# Patient Record
Sex: Male | Born: 1945 | Race: White | Hispanic: No
Health system: Southern US, Community
[De-identification: ages and names within clinical notes are randomized; demographics above are authoritative.]

---

## 2013-03-07 LAB — COMPREHENSIVE METABOLIC PANEL
Albumin: 3.2 g/dL — ABNORMAL LOW (ref 3.4–5.0)
Alkaline Phosphatase: 154 U/L — ABNORMAL HIGH (ref 50–136)
Anion Gap: 6 — ABNORMAL LOW (ref 7–16)
BUN: 21 mg/dL — ABNORMAL HIGH (ref 7–18)
Bilirubin,Total: 0.5 mg/dL (ref 0.2–1.0)
Chloride: 100 mmol/L (ref 98–107)
Co2: 35 mmol/L — ABNORMAL HIGH (ref 21–32)
Creatinine: 1.27 mg/dL (ref 0.60–1.30)
EGFR (African American): >60
EGFR (Non-African Amer.): 59 — ABNORMAL LOW
Glucose: 270 mg/dL — ABNORMAL HIGH (ref 65–99)
Osmolality: 294 (ref 275–301)
Potassium: 4.9 mmol/L (ref 3.5–5.1)
SGOT(AST): 13 U/L — ABNORMAL LOW (ref 15–37)
SGPT (ALT): 19 U/L (ref 12–78)
Total Protein: 7.1 g/dL (ref 6.4–8.2)

## 2013-03-07 LAB — TROPONIN I: Troponin-I: 0.14 ng/mL — ABNORMAL HIGH

## 2013-03-07 LAB — URINALYSIS, COMPLETE
Bacteria: NONE SEEN
Ph: 6 (ref 4.5–8.0)
Protein: NEGATIVE
RBC,UR: NONE SEEN /HPF (ref 0–5)
Specific Gravity: 1.008 (ref 1.003–1.030)
Squamous Epithelial: <1
WBC UR: <1 /HPF (ref 0–5)

## 2013-03-07 LAB — PROTIME-INR: INR: 1.1

## 2013-03-07 LAB — CBC
HGB: 13.5 g/dL (ref 13.0–18.0)
MCH: 29.4 pg (ref 26.0–34.0)
MCHC: 31.9 g/dL — ABNORMAL LOW (ref 32.0–36.0)
MCV: 92 fL (ref 80–100)
RBC: 4.59 10*6/uL (ref 4.40–5.90)
RDW: 17.1 % — ABNORMAL HIGH (ref 11.5–14.5)

## 2013-03-07 LAB — APTT: Activated PTT: 30 secs (ref 23.6–35.9)

## 2013-03-08 ENCOUNTER — Inpatient Hospital Stay: Payer: Self-pay | Admitting: Specialist

## 2013-03-08 LAB — TROPONIN I
Troponin-I: 0.2 ng/mL — ABNORMAL HIGH
Troponin-I: 0.22 ng/mL — ABNORMAL HIGH

## 2013-03-08 LAB — CK TOTAL AND CKMB (NOT AT ARMC)
CK, Total: 46 U/L (ref 35–232)
CK-MB: 3.4 ng/mL (ref 0.5–3.6)
CK-MB: 4.7 ng/mL — ABNORMAL HIGH (ref 0.5–3.6)

## 2013-03-09 LAB — CBC WITH DIFFERENTIAL/PLATELET
Basophil #: 0.1 10*3/uL (ref 0.0–0.1)
Basophil %: 0.8 %
HCT: 38.5 % — ABNORMAL LOW (ref 40.0–52.0)
HGB: 12.5 g/dL — ABNORMAL LOW (ref 13.0–18.0)
MCH: 29.8 pg (ref 26.0–34.0)
MCHC: 32.4 g/dL (ref 32.0–36.0)
Monocyte %: 11.5 %
Neutrophil %: 71.3 %
RDW: 16.7 % — ABNORMAL HIGH (ref 11.5–14.5)
WBC: 6.8 10*3/uL (ref 3.8–10.6)

## 2013-03-09 LAB — BASIC METABOLIC PANEL
BUN: 23 mg/dL — ABNORMAL HIGH (ref 7–18)
Calcium, Total: 8.5 mg/dL (ref 8.5–10.1)
Chloride: 94 mmol/L — ABNORMAL LOW (ref 98–107)
Creatinine: 1.15 mg/dL (ref 0.60–1.30)
EGFR (African American): >60
Osmolality: 276 (ref 275–301)
Potassium: 4 mmol/L (ref 3.5–5.1)

## 2013-03-09 LAB — HEMOGLOBIN A1C: Hemoglobin A1C: 9.1 % — ABNORMAL HIGH (ref 4.2–6.3)

## 2013-03-10 LAB — BASIC METABOLIC PANEL
Glucose: 120 mg/dL — ABNORMAL HIGH (ref 65–99)
Sodium: 136 mmol/L (ref 136–145)

## 2013-03-11 LAB — BASIC METABOLIC PANEL
Chloride: 93 mmol/L — ABNORMAL LOW (ref 98–107)
Co2: 43 mmol/L (ref 21–32)
EGFR (African American): >60
Glucose: 107 mg/dL — ABNORMAL HIGH (ref 65–99)
Potassium: 3.6 mmol/L (ref 3.5–5.1)
Sodium: 136 mmol/L (ref 136–145)

## 2013-03-12 LAB — CULTURE, BLOOD (SINGLE)

## 2013-03-13 LAB — WOUND CULTURE

## 2013-03-14 ENCOUNTER — Inpatient Hospital Stay: Payer: Self-pay | Admitting: Internal Medicine

## 2013-03-14 LAB — CBC
HCT: 38.2 % — ABNORMAL LOW (ref 40.0–52.0)
HGB: 12.6 g/dL — ABNORMAL LOW (ref 13.0–18.0)
MCH: 30.2 pg (ref 26.0–34.0)
MCHC: 33 g/dL (ref 32.0–36.0)
Platelet: 186 10*3/uL (ref 150–440)
RBC: 4.17 10*6/uL — ABNORMAL LOW (ref 4.40–5.90)
RDW: 16.8 % — ABNORMAL HIGH (ref 11.5–14.5)

## 2013-03-14 LAB — HEPATIC FUNCTION PANEL A (ARMC)
Albumin: 3.1 g/dL — ABNORMAL LOW (ref 3.4–5.0)
Alkaline Phosphatase: 232 U/L — ABNORMAL HIGH (ref 50–136)
Bilirubin, Direct: 0.2 mg/dL (ref 0.00–0.20)
Bilirubin,Total: 0.5 mg/dL (ref 0.2–1.0)
SGOT(AST): 53 U/L — ABNORMAL HIGH (ref 15–37)
SGPT (ALT): 56 U/L (ref 12–78)
Total Protein: 6.8 g/dL (ref 6.4–8.2)

## 2013-03-14 LAB — CK TOTAL AND CKMB (NOT AT ARMC)
CK, Total: 37 U/L (ref 35–232)
CK-MB: 1.9 ng/mL (ref 0.5–3.6)

## 2013-03-14 LAB — BASIC METABOLIC PANEL
Anion Gap: 2 — ABNORMAL LOW (ref 7–16)
Calcium, Total: 8.9 mg/dL (ref 8.5–10.1)
Co2: 39 mmol/L — ABNORMAL HIGH (ref 21–32)
EGFR (African American): >60
EGFR (Non-African Amer.): >60
Osmolality: 285 (ref 275–301)
Sodium: 137 mmol/L (ref 136–145)

## 2013-03-14 LAB — PRO B NATRIURETIC PEPTIDE: B-Type Natriuretic Peptide: 10045 pg/mL — ABNORMAL HIGH (ref 0–125)

## 2013-03-14 LAB — TROPONIN I: Troponin-I: 0.17 ng/mL — ABNORMAL HIGH

## 2013-03-15 LAB — BASIC METABOLIC PANEL
BUN: 26 mg/dL — ABNORMAL HIGH (ref 7–18)
Calcium, Total: 8.5 mg/dL (ref 8.5–10.1)
Chloride: 93 mmol/L — ABNORMAL LOW (ref 98–107)
Co2: 45 mmol/L (ref 21–32)
EGFR (Non-African Amer.): >60
Glucose: 191 mg/dL — ABNORMAL HIGH (ref 65–99)
Osmolality: 282 (ref 275–301)
Potassium: 3.6 mmol/L (ref 3.5–5.1)
Sodium: 136 mmol/L (ref 136–145)

## 2013-03-15 LAB — CBC WITH DIFFERENTIAL/PLATELET
Basophil #: 0 10*3/uL (ref 0.0–0.1)
Basophil %: 0.5 %
Eosinophil %: 1.9 %
HCT: 37.7 % — ABNORMAL LOW (ref 40.0–52.0)
Lymphocyte %: 11.1 %
MCH: 28.1 pg (ref 26.0–34.0)
Neutrophil #: 6.1 10*3/uL (ref 1.4–6.5)
Neutrophil %: 74.4 %
Platelet: 183 10*3/uL (ref 150–440)
RBC: 4.12 10*6/uL — ABNORMAL LOW (ref 4.40–5.90)
RDW: 16.3 % — ABNORMAL HIGH (ref 11.5–14.5)
WBC: 8.2 10*3/uL (ref 3.8–10.6)

## 2013-03-15 LAB — TROPONIN I: Troponin-I: 0.18 ng/mL — ABNORMAL HIGH

## 2013-03-15 LAB — MAGNESIUM: Magnesium: 1.8 mg/dL

## 2013-03-15 LAB — PRO B NATRIURETIC PEPTIDE: B-Type Natriuretic Peptide: 9300 pg/mL — ABNORMAL HIGH (ref 0–125)

## 2013-03-17 LAB — BASIC METABOLIC PANEL
BUN: 25 mg/dL — ABNORMAL HIGH (ref 7–18)
Chloride: 90 mmol/L — ABNORMAL LOW (ref 98–107)
Co2: 41 mmol/L (ref 21–32)
Creatinine: 1.07 mg/dL (ref 0.60–1.30)
EGFR (African American): >60
EGFR (Non-African Amer.): >60
Potassium: 3.6 mmol/L (ref 3.5–5.1)

## 2013-04-04 ENCOUNTER — Inpatient Hospital Stay: Payer: Self-pay | Admitting: Internal Medicine

## 2013-04-04 LAB — COMPREHENSIVE METABOLIC PANEL
Alkaline Phosphatase: 381 U/L — ABNORMAL HIGH (ref 50–136)
BUN: 21 mg/dL — ABNORMAL HIGH (ref 7–18)
Bilirubin,Total: 0.7 mg/dL (ref 0.2–1.0)
Calcium, Total: 9.3 mg/dL (ref 8.5–10.1)
Creatinine: 1.04 mg/dL (ref 0.60–1.30)
EGFR (African American): >60
EGFR (Non-African Amer.): >60
Osmolality: 276 (ref 275–301)
SGPT (ALT): 47 U/L (ref 12–78)

## 2013-04-04 LAB — CBC
MCH: 30.1 pg (ref 26.0–34.0)
MCV: 93 fL (ref 80–100)
RBC: 4.65 10*6/uL (ref 4.40–5.90)
WBC: 7.3 10*3/uL (ref 3.8–10.6)

## 2013-04-04 LAB — CK TOTAL AND CKMB (NOT AT ARMC)
CK, Total: 31 U/L — ABNORMAL LOW (ref 35–232)
CK-MB: 2.8 ng/mL (ref 0.5–3.6)

## 2013-04-05 LAB — CBC WITH DIFFERENTIAL/PLATELET
Basophil #: 0.1 10*3/uL (ref 0.0–0.1)
HCT: 37.2 % — ABNORMAL LOW (ref 40.0–52.0)
Lymphocyte #: 0.7 10*3/uL — ABNORMAL LOW (ref 1.0–3.6)
Lymphocyte %: 10.6 %
MCH: 30.3 pg (ref 26.0–34.0)
Neutrophil #: 5.2 10*3/uL (ref 1.4–6.5)
Neutrophil %: 76.4 %
Platelet: 169 10*3/uL (ref 150–440)
RDW: 15.9 % — ABNORMAL HIGH (ref 11.5–14.5)
WBC: 6.9 10*3/uL (ref 3.8–10.6)

## 2013-04-05 LAB — COMPREHENSIVE METABOLIC PANEL
Alkaline Phosphatase: 328 U/L — ABNORMAL HIGH (ref 50–136)
Anion Gap: 4 — ABNORMAL LOW (ref 7–16)
Co2: 38 mmol/L — ABNORMAL HIGH (ref 21–32)
EGFR (African American): >60
EGFR (Non-African Amer.): >60
Osmolality: 283 (ref 275–301)
Potassium: 4.3 mmol/L (ref 3.5–5.1)
SGOT(AST): 18 U/L (ref 15–37)
SGPT (ALT): 33 U/L (ref 12–78)
Sodium: 137 mmol/L (ref 136–145)
Total Protein: 6.2 g/dL — ABNORMAL LOW (ref 6.4–8.2)

## 2013-04-05 LAB — PROTIME-INR
INR: 1.2
Prothrombin Time: 15.2 secs — ABNORMAL HIGH (ref 11.5–14.7)

## 2013-04-06 LAB — BASIC METABOLIC PANEL
Anion Gap: 0 — ABNORMAL LOW (ref 7–16)
Calcium, Total: 8.6 mg/dL (ref 8.5–10.1)
Co2: 42 mmol/L (ref 21–32)
Creatinine: 1.11 mg/dL (ref 0.60–1.30)
EGFR (Non-African Amer.): >60
Potassium: 3.8 mmol/L (ref 3.5–5.1)

## 2013-04-07 LAB — BASIC METABOLIC PANEL
Anion Gap: 1 — ABNORMAL LOW (ref 7–16)
BUN: 26 mg/dL — ABNORMAL HIGH (ref 7–18)
Calcium, Total: 8.8 mg/dL (ref 8.5–10.1)
Creatinine: 1.06 mg/dL (ref 0.60–1.30)
EGFR (African American): >60
EGFR (Non-African Amer.): >60
Glucose: 99 mg/dL (ref 65–99)
Osmolality: 275 (ref 275–301)
Potassium: 4 mmol/L (ref 3.5–5.1)

## 2013-05-30 DEATH — deceased

## 2014-03-12 IMAGING — CR DG CHEST 1V PORT
1 series · 1 of 1 positions shown · non-contrast
Comparison: none

REASON FOR EXAM: SOB
COMMENTS:

[ap]
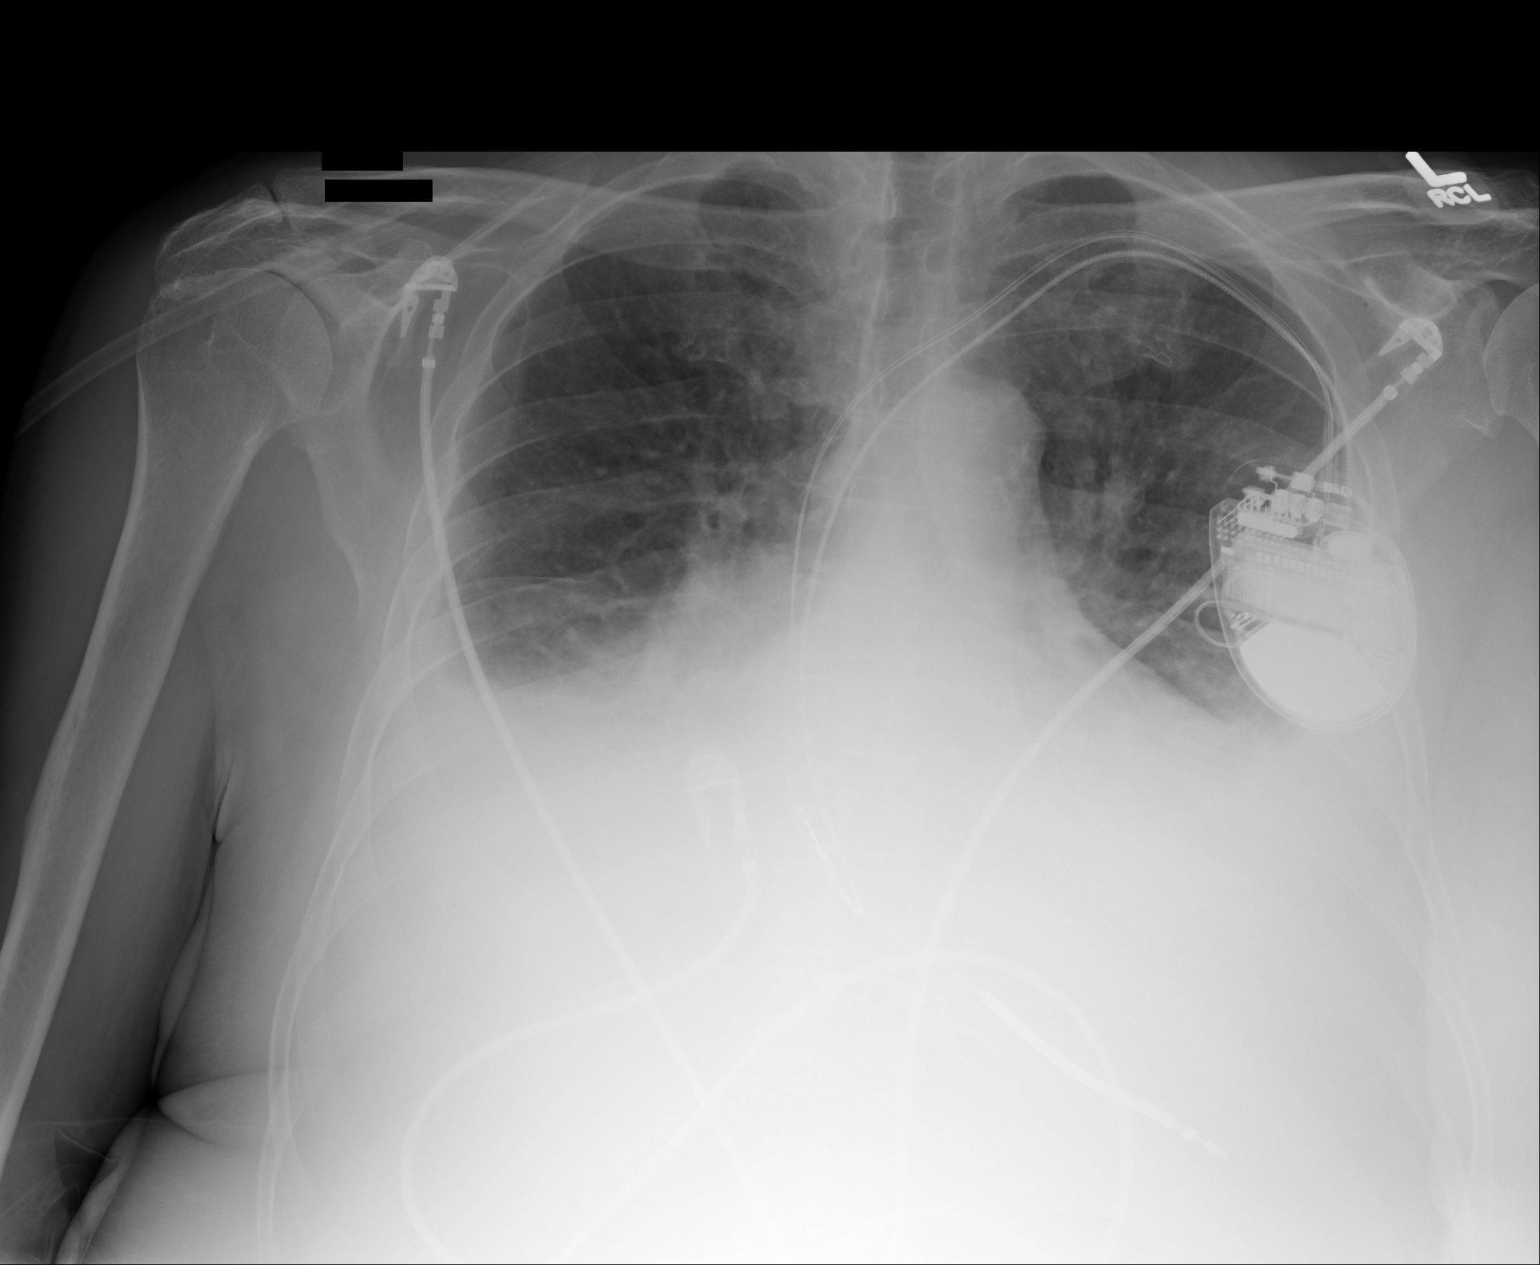

[1 of 1 positions shown; findings below may reference images not displayed]

PROCEDURE:     DXR - DXR PORTABLE CHEST SINGLE VIEW  - March 14, 2013  [DATE]

RESULT:     Comparison is made to the study March 08, 2013.

The lung volumes remain low. There are bilateral pleural effusions blunting
the costophrenic gutters. The cardiac silhouette is enlarged but largely
obscured. The pulmonary vascularity is engorged. A permanent
pacemaker-defibrillator is in place.
IMPRESSION: The findings are consistent with congestive heart failure
with bilateral pleural effusions. There remains mild pulmonary interstitial
edema which has improved somewhat since March 08, 2013.

[REDACTED]

## 2014-03-14 IMAGING — CR DG CHEST 2V
1 series · 2 of 2 positions shown · non-contrast
Comparison: none

REASON FOR EXAM: sob
COMMENTS:

[Series 4: w chest pa · 0.14mm/px · 2 of 2 slices shown]
[im 1/2]
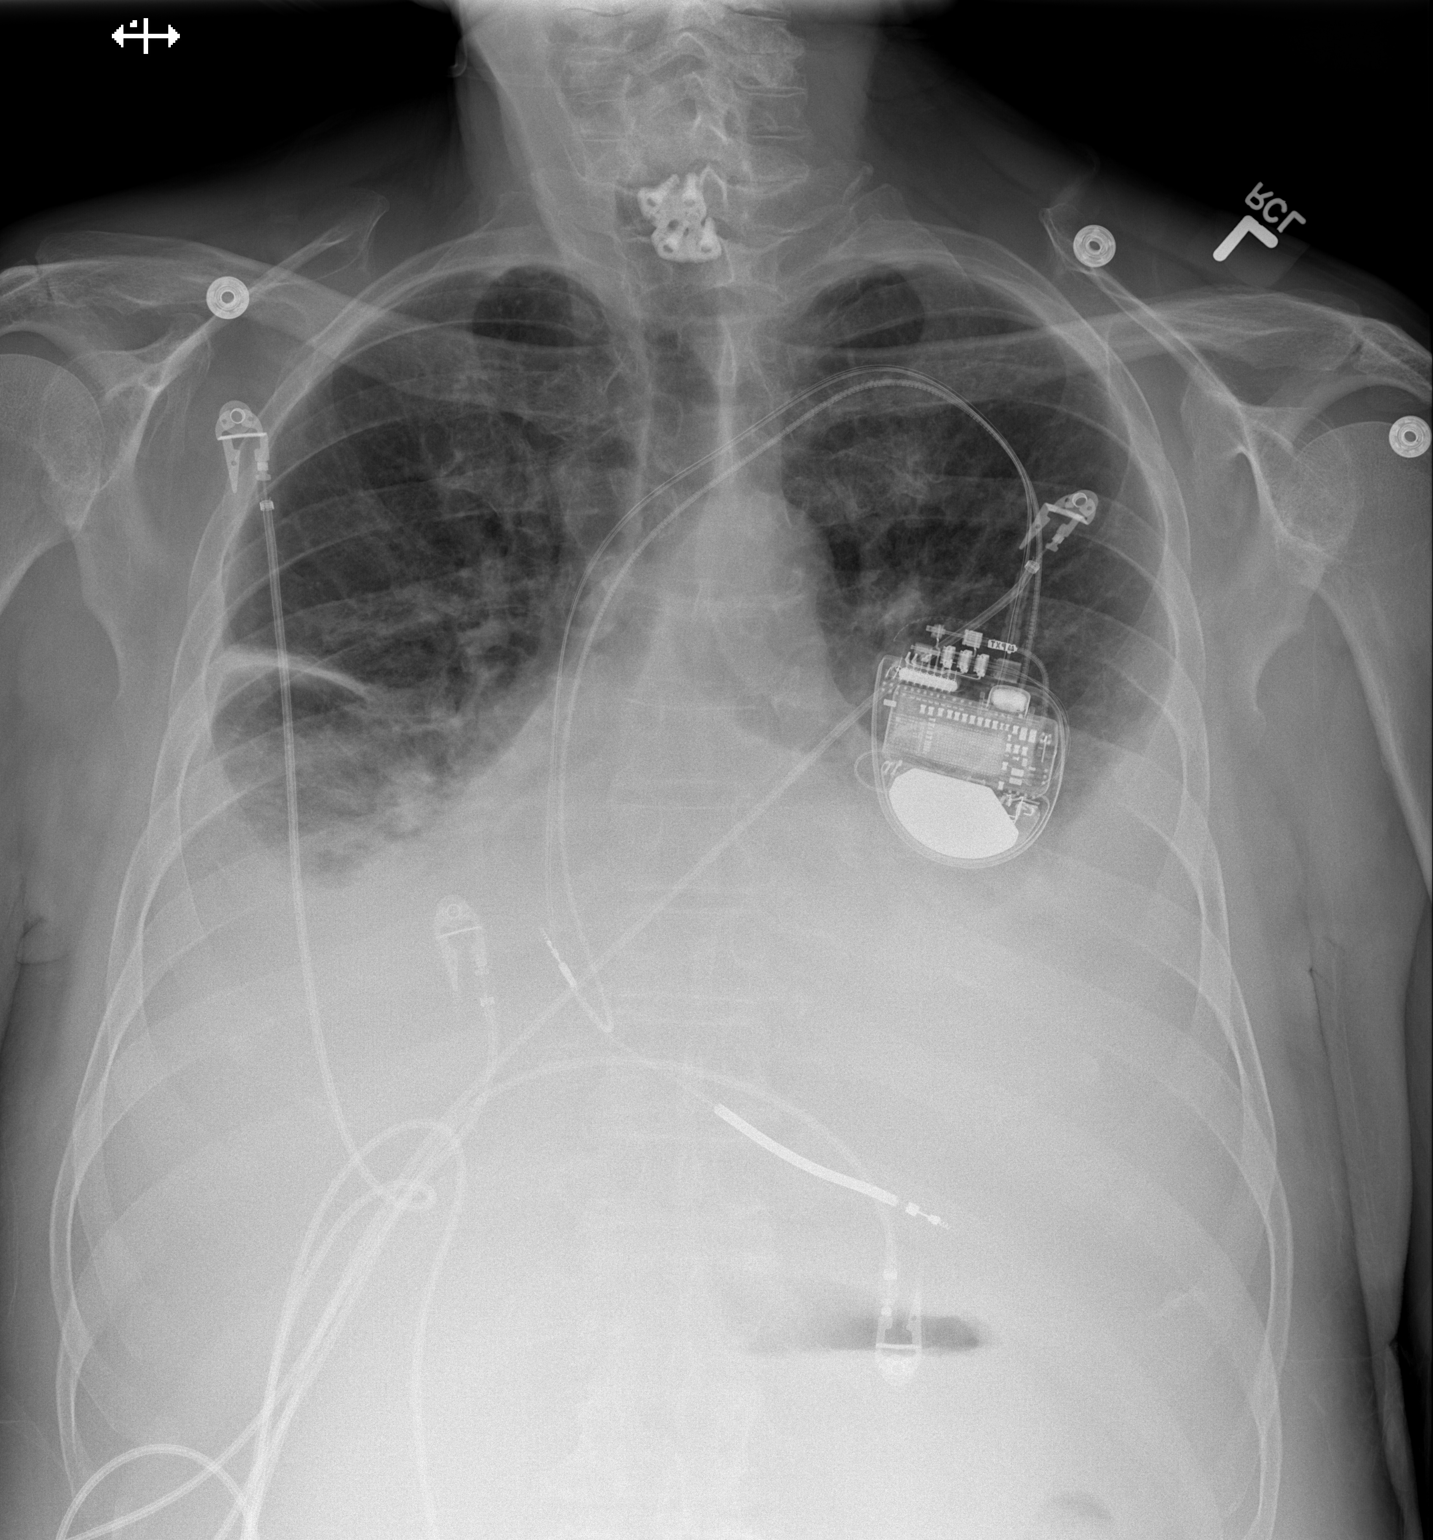
[im 2/2]
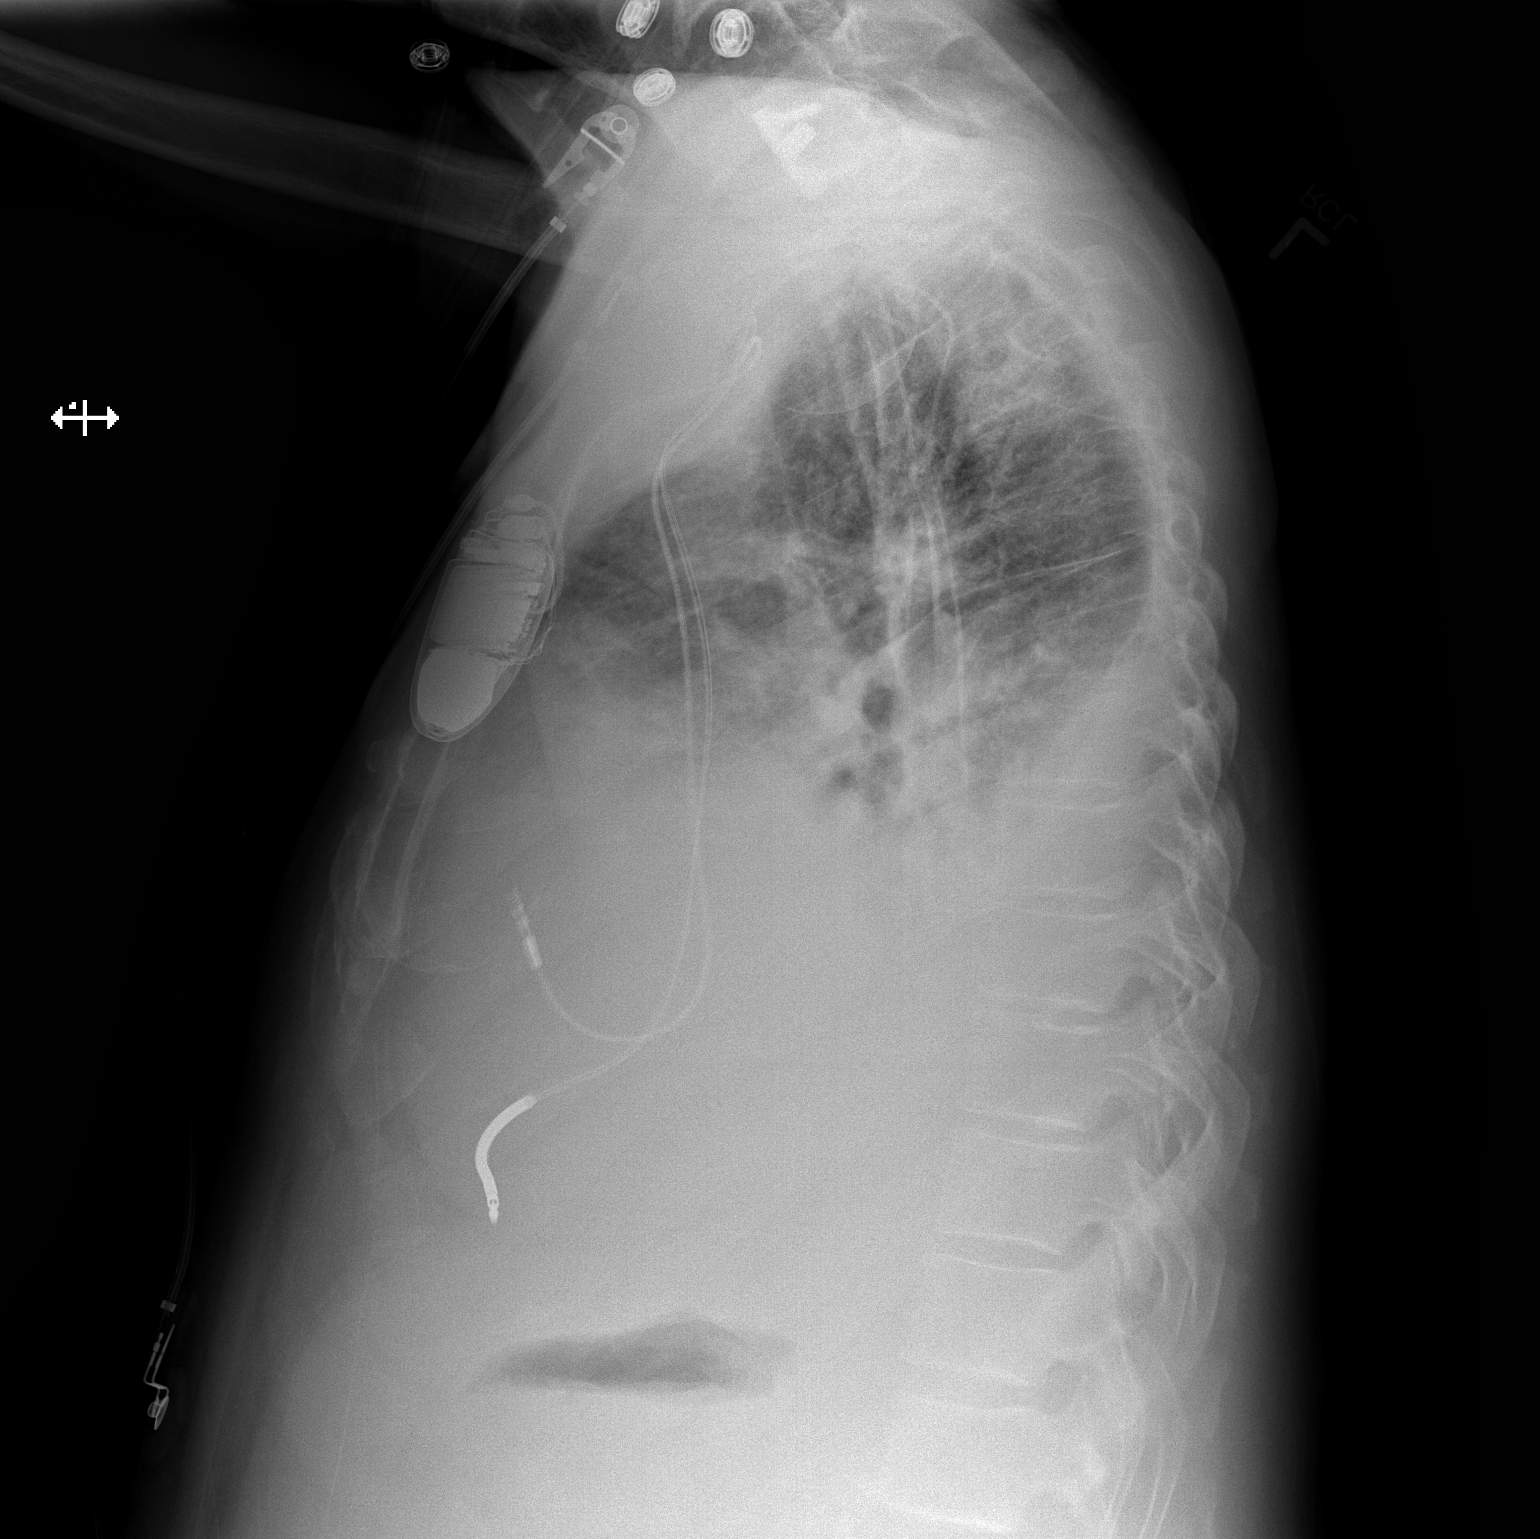

[2 of 2 positions shown; findings below may reference images not displayed]

PROCEDURE:     DXR - DXR CHEST PA (OR AP) AND LATERAL  - March 16, 2013 [DATE]

RESULT:     Comparison is made to the study March 14, 2013.

There are moderate sized bilateral pleural effusions. The volume of fluid on
the right has increased since the previous study. The cardiac silhouette
remains enlarged. The pulmonary interstitial markings are more prominent
today and the pulmonary vascularity remains engorged. A permanent
pacemaker-defibrillator is in place.
IMPRESSION: The findings are consistent with  worsening of pulmonary
interstitial edema and an increase in the size of a right pleural effusion
secondary to CHF.

[REDACTED]

## 2014-12-20 NOTE — Discharge Summary (Signed)
PATIENT NAME:  Victor, Davenport MR#:  [id number] DATE OF BIRTH:  [birth year]  DATE OF ADMISSION:  03/08/2013 DATE OF DISCHARGE:  03/11/2013  For a detailed note, please take a look at the history and physical done on admission by Dr. Amado Coe.     DIAGNOSES AT DISCHARGE: As follows:  1.  Congestive heart failure, acute on chronic systolic dysfunction.  2.  Scrotal and lower extremity edema secondary to the congestive heart failure.  3.  Right groin fungal rash.  4.  Right inguinal hernia.  5.  Cardiomyopathy, ejection fraction of 15%.  6.  Diabetes.  7.  Hyperlipidemia.  8.  Anxiety.   DIET: The patient is being discharged on a low-sodium, low-fat, American diabetic Association diet.   ACTIVITY: As tolerated.   FOLLOWUP:  Follow up with Dr. Egbert Garibaldi in the next 1 to 2 weeks. Also, follow up with Dr. Nash Dimmer, the patient's primary care physician, in the next 1 to 2 weeks.   DISCHARGE MEDICATIONS:  NovoLog flex pen based on sliding scale, Lipitor 80 mg at bedtime, Aldactone 25 mg, 12.5 daily which is 1/2 tab daily, aspirin 81 mg daily, Ativan 0.5 mg t.i.d. as needed, Plavix 75 mg daily, ramipril 1.25 mg daily, metformin 1000 mg b.i.d., Lasix 40 mg b.i.d. and nystatin powder to be applied to the right groin fungal rash b.i.d. for the next 7 to 10 days.   CONSULTANTS DURING THE HOSPITAL COURSE: Dr. Natale Lay from general surgery, Dr. Mariel Kansky from cardiology.   PERTINENT STUDIES DONE DURING THE HOSPITAL COURSE: Are as follows: A CT scan of the abdomen and pelvis done without contrast showing a large right inguinal hernia containing loops of large and small bowel, no evidence of obstruction or incarceration, bilateral pleural effusions, diffuse subcutaneous edema. A chest x-ray done on admission showing  congestive heart failure.   A 2-dimensional echocardiogram done July 10th showing left ventricular ejection fraction to be less than 20%, severely decreased global LV systolic function, mildly dilated  left atrium, mildly dilated right atrium, large pleural effusion, mild to moderate mitral valve regurgitation, mild to moderate tricuspid regurgitation.   BRIEF HOSPITAL COURSE: This is a 69 year old male with medical problems as mentioned above, presented to the hospital March 08, 2013, secondary to worsening bilateral lower extremity edema and scrotal edema and noted to be in acute on chronic congestive heart failure.  1.  Acute on chronic congestive heart failure. This is likely due to chronic systolic dysfunction as the patient has a previous cardiomyopathy with ejection fraction less than 20%. He presented with significant scrotal swelling and lower extremity edema. The patient was, therefore, started on aggressive IV diuretics with Lasix, continued on his beta blockers and ACE inhibitors. After getting aggressive IV diuresis, his lower extremity edema and scrotal edema has significantly improved. He clinically feels much better and, therefore, being discharged on oral Lasix, beta blockers, ACE inhibitors and Aldactone as mentioned. A cardiology consult was obtained and agreed with this management.  2.  Elevated troponin. This was likely in the setting of the CHF and demand ischemia. There was no evidence of acute coronary syndrome. A cardiology consult was obtained. They did not want to pursue any further aggressive intervention at this time. The patient will continue his aspirin, Plavix, beta blocker and statin as stated.  3.  Right inguinal hernia. The patient has a significant right inguinal hernia. A surgical consult was obtained for possible surgical treatment although given his decompensated CHF and cardiac status, he  was not an ideal candidate for surgery during this hospitalization. As per surgery, they would like to evaluate the patient as an outpatient for possible right inguinal hernia repair. A CT scan the abdomen and pelvis did not show any evidence of obstruction or incarceration.  4.  Right  groin fungal rash. The patient was started on nystatin powder twice daily and it has significantly improved.  He will continue using that for the next 7 to 10 days.  5.  Diabetes. While in the hospital, the patient was maintained on sliding scale insulin. He will resume that. Also resume his metformin as his renal function has improved upon discharge.  6.  Hyperlipidemia. The patient was maintained on his atorvastatin, he will resume that.   The patient is being discharged home with home health nursing and physical therapy services.   TIME SPENT ON DISCHARGE: 40 minutes.    ____________________________ Victor PancakeVivek J. Cherlynn KaiserSainani, MD vjs:cs D: 03/11/2013 13:27:00 ET T: 03/11/2013 19:15:43 ET JOB#: [id number]  cc: Victor PancakeVivek J. Cherlynn KaiserSainani, MD, <Dictator> Victor A. Egbert GaribaldiBird, MD Dr. Lorre Davenport Victor J SAINANI MD ELECTRONICALLY SIGNED 03/12/2013 20:44

## 2014-12-20 NOTE — H&P (Signed)
PATIENT NAME:  Victor Davenport, [name] MR#:  [id number] DATE OF BIRTH:  1946-02-19  DATE OF ADMISSION:  03/14/2013  CHIEF COMPLAINT: Increased shortness of breath, hypoxia.   Sent from the nurse of Home Health Therapy.   REFERRING PHYSICIAN: Dr. Glennie IsleSheryl Gottlieb.   PRIMARY CARE PHYSICIAN: Unknown.   HISTORY OF PRESENT ILLNESS: The patient is a very nice 69 year old gentleman who has a history of congestive heart failure, ischemic cardiomyopathy with an ejection fraction of 15%. He was originally admitted over here on July 10th and discharged on July 13th.   The patient was admitted at that moment with a CHF exacerbation. The diagnosis at discharge was congestive heart failure with chronic systolic dysfunction, acute, and he also has some scrotal and lower extremity edema secondary to the congestive heart failure. He was treated for a fungal rash, and his Lasix was given to 40 mg twice daily. On top of that he was given Aldactone and lisinopril.   He was seen by Dr. Natale LayMark Bird and Dr. Harold HedgeKenneth Fath during that hospitalization. He was found to have a right inguinal hernia. There were very large containing loops of intestine, without any evidence of obstruction for which there was no surgical treatment planned. He had an echocardiogram that showed an ejection fraction severely decreased at 15%, 20%. He was given aggressive IV diuresis with Lasix until improved.   He had a chronic elevation of troponin, and he has a history of stents put in the past. The troponin was just related to demand ischemia.   The patient was discharged on the 13th, came back on the 16th with a history of being at home, having shortness of breath around 1 a.m. in the morning. He went to bed normally as he does, without any significant distress. He has been discharged on oxygen at 2 liters nasal cannula and he started having some leg pains in the middle of the night, cramping. Is starting to have a lot of shortness of breath despite  the fact that he was sleeping in the recliner. Whenever he was seen by the nurse from the home health therapy, he was found to gain 4 pounds on discharge, and his oxygen saturation was 72% on 2 liters nasal cannula. The nurse told him to come over here to the ER to be evaluated. In the ER he had a chest x-ray that showed significant pleural effusions, a CHF exacerbation. On top of that he had an elevation of troponin which is lower than the previous one. The patient is in significant shortness of breath, for which he needs to be admitted for continuation of treatment.   REVIEW OF SYSTEMS: CONSTITUTIONAL: Denies any fever, fatigue, weakness. Positive weight gain of 4 pounds. No weight loss.  EYES: No blurry vision, double vision, glaucoma, cataracts.  ENT: No epistasis, postnasal drip.  RESPIRATIONS: Denies any cough. Positive shortness of breath. Negative hemoptysis.  CARDIOVASCULAR: No chest pain. Positive severe orthopnea. Positive edema. No arrhythmias, no palpitations. No syncope.  GASTROINTESTINAL: No nausea, vomiting, jaundice or hemorrhoidal bleeding.  GENITOURINARY: No dysuria, hematuria. Positive increased frequency with Lasix. No prostatitis. Positive for scrotal edema due to CHF. Positive hernia of the right inguinal area. Positive fungal infection at the level of the groin.  HEMATOLOGIC/LYMPHATIC: No anemia, easy bruising or swollen glands.  ENDOCRINOLOGIC: No polyuria, polydipsia, polyphagia, cold or heat intolerance.  SKIN: No rashes, petechiae other than the rash on the lower extremity which has improved significantly.  MUSCULOSKELETAL: No significant neck pain, back pain or  gout.  NEUROLOGIC: No numbness, tingling, CVA or TIA.  PSYCHIATRIC: No significant anxiety, insomnia or nervousness.   PAST MEDICAL HISTORY: 1.  Coronary artery disease.  2.  Congestive heart failure, systolic, with ejection fraction of 15 to 20%.  3.  Hypertension.  4.  Obesity.  5.  Hyperlipidemia.  6.   Insulin-dependent diabetes.  7.  History of MIs x 2.   PAST SURGICAL HISTORY: 1.  Stent placements x 2 in 2013 and x 2 after that.  2.  Status post permanent defibrillator.  3.  Tonsillectomy, as a child.   ALLERGIES: No known drug allergies.   SOCIAL HISTORY: The patient is married and lives with his wife. He denies any current drinking or any current smoking. He actually quit smoking in 2006. He smoked 2 packs a day for over 50 years.   FAMILY HISTORY: Positive for MI in his mom. Positive for cerebral hemorrhage in his dad. No cancer. No diabetes.   MEDICATIONS: Ramipril 1.25 mg once a day, Plavix 75 mg once daily, Nystatin 100,000 units, apply to affected area, NovoLog insulin, sliding-scale, metformin 1000 mg twice daily, Lipitor 80 mg once a day, furosemide 40 mg twice daily, Ativan 0.5 mg 3 times a day, aspirin  81 mg once a day, Aldactone 25 mg orally, to be increased once a day; we are going to increase it to twice a day.   PHYSICAL EXAMINATION: VITAL SIGNS: Blood pressure 107/81, pulse 98, at the beginning 102, respirations 30, temperature 97.7, oxygen saturation 86% on 2 liters nasal cannula.  GENERAL: The patient is alert, oriented x 3. There is mild respiratory distress, with mild use of accessory muscles, especially when he gets up, moves around or talks. There is no significant sites of pain.  HEENT:  His pupils are equal and reactive. Extraocular movements are intact. Mucosa are moist. Anicteric sclerae. Pink conjunctivae. No oral lesions. No oropharyngeal exudates.  NECK: Supple. No JVD. No thyromegaly. No adenopathy. No carotid bruits. No masses. No rigidity.  CARDIOVASCULAR: Regular rate and rhythm. No murmurs, rubs, or gallops are appreciated at this moment. Positive S3, which actually after auscultating deeply there is some gallop. No displacement of PMI. No tenderness to palpation, anterior chest wall.  LUNGS: Decreased respiratory sounds in both bases all the way to the  middle lung fields. There are some crackles from the middle lung to upper lung fields. No wheezing. No rhonchi. Positive use of accessory muscles. Positive dullness to percussion of the bilateral bases.  ABDOMEN: Soft, nontender, nondistended. No hepatosplenomegaly. No masses. Bowel sounds are positive.  GENITAL EXAM: Positive edema of the testicular area and his scrotum, also the skin surrounding the penis. The patient has significant edema and a large right inguinal hernia, which is nontender. The patient has erythema secondary to fungal infection at the level of both groins.  EXTREMITIES: Positive edema +2. No cyanosis or clubbing. There is some mild erythema and some healing blisters at the level of the pretibial area on the right lower extremity.  VASCULAR: Pulses +2. Capillary refill less than 3.  SKIN: As mentioned above, rashes and right inguinal hernia, right lower extremity, which is a healing rash due to the edema. No signs of infection. No signs of cellulitis at this moment.   Very dry skin on lower extremities, and changes secondary to vascular insufficiency with  discoloration of the skin.   NEUROLOGIC: Cranial nerves II-XII intact. Strength is 5/5 in all 4 extremities.   Dictation ended here.  Please  see addendum for continuation of dictation.    ____________________________ Felipa Furnace, MD rsg:dm D: 03/14/2013 13:15:00 ET T: 03/14/2013 13:57:54 ET JOB#: [id number]  cc: Felipa Furnace, MD, <Dictator> Uri Covey Juanda Chance MD ELECTRONICALLY SIGNED 03/15/2013 20:30

## 2014-12-20 NOTE — Consult Note (Signed)
PATIENT NAME:  Victor Davenport, Victor Davenport MR#:  [id number] DATE OF BIRTH:  [birth year]  DATE OF CONSULTATION:  03/08/2013  REFERRING PHYSICIAN:   CONSULTING PHYSICIAN:  Chrisy Hillebrand A. Egbert Garibaldi, MD  REASON FOR CONSULTATION:  Right inguinal hernia.   HISTORY OF PRESENT ILLNESS:  This is a 69 year old white male with a significant history of hypertension, coronary artery disease, status post multiple percutaneous coronary artery interventions with stenting who refers himself to the Emergency Room after relocating from the [facility] area to [place] with a one-week history of worsening right groin pain.  The patient has not been evaluated by surgery in the past for this.  He states that he has had this hernia for at least three months that he can tell, because "I do not like doctors."  I was asked by the Emergency Room physician to evaluate for possible incarceration of the hernia and possible cellulitis.  He denies any abdominal pain, nausea, vomiting.  He is eating okay.  White count is normal.   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    PAST SURGICAL HISTORY:  As described above.  Nothing on the abdomen.  No previous hernia repairs.   SOCIAL HISTORY:  The patient is a reformed cigarette smoker.  Does not drink.  Does not use drugs, is married, recently relocated from [place] to [place], currently disabled.   MEDICATIONS:  Plavix, Aldactone, metformin, insulin, aspirin, Plavix, Ramipril, Lipitor, Lasix 40 mg 3 times a day.   PHYSICAL EXAMINATION: GENERAL:  The patient is disheveled.  No obvious distress, chronically ill-appearing male, significant lower extremity edema and anasarca of his abdomen.  VITAL SIGNS:  Temperature is 98.3, pulse 105, blood pressure is 127/82.  LUNGS:  Diminished bilaterally at the bases.   ABDOMEN:  Soft.  No scars.  No abdominal wall  hernias except for a large right inguinal hernia which was nonreducible, minimally tender.  There is extensive intertriginous zone candidiasis.  There is significant scrotal edema.  Significant lower extremity edema.  No evidence of a left inguinal hernia is present.  HEART:  Regular rate and rhythm.  There is evidence of a left chest defibrillator.  NEUROLOGIC AND PSYCHIATRIC:  Grossly normal.   LABORATORY VALUES:  BNP is 9286.  PTT is 30.  Pro Time is 14.3.  Troponin is 0.14.  Glucose is 270, BUN of 21, creatinine 1.27, potassium 4.9.  Liver function tests, serum albumin is 3.2, alkaline phosphatase 154.  White count is 8.6, hemoglobin 13.5, platelet count 197,000.  Review of CT scan demonstrates significant bilateral pleural effusions, cardiomegaly, diffuse abdominal wall anasarca.  The right groin demonstrates a large inguinal hernia containing nondilated and nonobstructive-appearing contrast-filled small bowel.  There is contrast seen within the colon consistent with no signs of obstruction.  A small left inguinal hernia is seen on CT scan, but not on exam.   IMPRESSION:  Chronically incarcerated right inguinal hernia with no signs of strangulation or obstruction.  The patient has significant coronary artery disease and congestive heart failure, which appears to be not well-compensated.  The patient is a very poor surgical risk both from his cardiac disease, congestive heart failure and his local infectious issues in the right groin which would preclude any type of mesh placement at this time.   RECOMMENDATIONS:  I recommended medical admission for adjustment of his heart failure medications, cardiac function optimization, risk stratification, cardiology evaluation.  Once this is achieved, outpatient hernia repair can be achieved.  I discussed this with Dr. Lavella LemonsManly of the Emergency Room.    ____________________________ Redge GainerMark A. Egbert GaribaldiBird, MD Verta Ellenmab:ea D: 03/08/2013 01:06:35 ET T: 03/08/2013 01:43:47  ET JOB#: [id number]  cc: Loraine LericheMark A. Egbert GaribaldiBird, MD, <Dictator> Everley Evora A Sharni Negron MD ELECTRONICALLY SIGNED 03/08/2013 21:29

## 2014-12-20 NOTE — H&P (Signed)
PATIENT NAME:  Victor Davenport, Victor Davenport MR#:  [id number] DATE OF BIRTH:  1945/09/30  DATE OF ADMISSION:  03/14/2013  Addendum. This is a continuation.  CONTINUATION OF PHYSICAL EXAMINATION:  PSYCHOLOGIC: No alteration of judgment. Alert and oriented x3.  MUSCULOSKELETAL: No joint effusions or joint abnormalities. No significant erythema of the joints.  RESULTS: BNP 10,000. Glucose 216, creatinine 1.09, sodium 137, potassium 4.3, CO2 elevated at 39.  po  53, alkaline phosphatase 232, albumin 3.1. White blood cells 6.7, hemoglobin 12.6, platelet count 186. Chest x-ray: As mentioned above. ABG: pO2 of 47, pCO2 of 60, HCO3 of 40.   ASSESSMENT AND PLAN: A 69 year old gentleman who comes with acute respiratory failure, has history of congestive heart failure, systolic, and ejection fraction of 15%, diabetes, hypertension and hyperlipidemia.   1. Acute respiratory failure. The patient with a pCO2 in the 40s on the ABG. He is on oxygen replacement, oxygen increased to 5 liters. The patient is oxygenating a little bit better. At this moment, the patient has a mixed problem. He has pulmonary edema, and on top of that, large pleural effusions. We are going to do a thoracentesis of the right pleural effusion to increase his lung capacity and help him breathe better. Procedure is completed, thoracentesis. Please look at procedure note. The patient immediately feels much better, and he stops using accessory muscles after the procedure is done. Drainage of 500 mL of fluid. No complications.  2. Congestive heart failure. The patient has significant history of cardiomyopathy, ischemic, with an ejection fraction of 15% to 20%. The patient is on full support with an ACE inhibitor, Lasix and a sparing potassium. He is not on a beta blocker despite the fact that he has significant increase in heart rate. We are going to start him on Coreg. His blood pressure can tolerate it at low dose. We are going to monitor for his  progression with this medication.  3. Coronary artery disease. Add beta blocker. Continue ACE inhibitor, continue statin. The patient has history of stent placement. He has an elevation of troponin, which is actually lower than previous, and this is due to demand ischemia secondary to his congestive heart failure.  4. As far as his systolic congestive heart failure, the patient is going to be on monitoring weight, monitoring under telemetry. He admits needs to drinking a full gallon of milk in the last 24 hours. He admits to drinking a lot of Diet [facility]. We are going to administer more education for the patient as far as congestive heart failure diet. We insist on fluid restriction of at least 64 ounces a day. The patient is aware of this.  5. Bilateral pleural effusions. The patient is status post thoracentesis of the right pleural effusion. This is likely secondary to congestive heart failure. We did not send the sample for special testing. It was just a therapeutic thoracentesis. 6. Mixed acid base abnormality with respiratory acidosis and metabolic alkalosis. This is secondary to possible sleep apnea on top of the patient is having increased diuresis. The metabolic alkalosis is secondary to his Lasix. Consider use of Zaroxolyn.  7. Deep vein thrombosis prophylaxis with Lovenox.  8. For his diabetes and hypertension, all seemed controlled. Continue home medications. Continue insulin sliding scale.  9. The patient to be discharged in the next day or two with home health back again.   CODE STATUS: He is a full code.   TIME SPENT: I spent about 60 minutes with this patient, without accounting  for procedure.   ____________________________ Felipa Furnaceoberto Sanchez Gutierrez, MD rsg:OSi D: 03/14/2013 13:22:20 ET T: 03/14/2013 14:22:34 ET JOB#: [id number]  cc: Felipa Furnaceoberto Sanchez Gutierrez, MD, <Dictator> Hermione Havlicek Juanda ChanceSANCHEZ GUTIERRE MD ELECTRONICALLY SIGNED 03/15/2013 20:27

## 2014-12-20 NOTE — Discharge Summary (Signed)
PATIENT NAME:  Victor Davenport, Victor Davenport MR#:  [id number] DATE OF BIRTH:  05-22-1946  DATE OF ADMISSION:  03/14/2013 DATE OF DISCHARGE:  03/17/2013  PRIMARY CARE PHYSICIAN:  Dr. Marina GoodellPerry   DISCHARGE DIAGNOSES: 1.  Acute on chronic respiratory failure.  2.  Acute on chronic systolic congestive heart failure.  3.  Elevated troponin secondary to demand ischemia.  4.  Right inguinal hernia, chronic.  5.  Chronic obstructive pulmonary disease.    IMAGING STUDIES: Include a chest x-ray which showed bilateral pulmonary edema. No infiltrates.   CONSULTATIONS: Dr. Juliann Paresallwood of cardiology for elevated troponin who advised medical management.   ADMITTING HISTORY AND PHYSICAL AND HOSPITAL COURSE:  Please see detailed H and P dictated by Dr. Mordecai MaesSanchez on 03/14/2013. In brief, a 69 year old patient with ejection fraction of 15% to 20%, presented to the hospital complaining of worsening shortness of breath. The patient was found to have acute on chronic systolic CHF and started on IV Lasix. The patient had diuresed well with over 4 liters in his 4 days stay. Creatinine has been stable. He feels back to baseline. Has requested he be discharged home and the patient will be discharged back home in a fair condition.   Today on exam, the patient does not have any crackles on lung examination. His Lasix has been increased from 40 mg b.i.d. to 80 mg in the morning and 40 mg at night. Prescription has been given. He has been counseled to check his weight every day, keep a log,  take to his primary care physician's office, call his doctor if he gains more than 3 pounds a day. He will also be on fluid restriction of less than 2 liters a day.   The patient had elevated troponin for which Dr. Juliann Paresallwood has seen the patient and advised medical management. No further testing was advised.   DISCHARGE MEDICATIONS: Include: 1.  NovoLog FlexPen sliding scale.  2.  Lipitor 80 mg daily.  3.  Aldactone 12.5 mg daily.  4.  Aspirin 81 mg  daily.  5.  Ativan 0.5 mg 3 times a day as needed.  6.  Plavix 75 mg daily.  7.  Ramipril 1.25 mg oral daily.  8.  Metformin 1000 mg oral 2 times a day.  9.  Lasix 80 mg in the morning and 40 mg in the evening.  10.  Advair 250/50 1 puff inhaled 2 times a day.  11.  Combivent Respimat 1 puff inhaled 4 times a day.   DISCHARGE INSTRUCTIONS: Low salt, fluid restricted diet. Activity as tolerated. Continue oxygen at 2 liters at home. Follow up with primary care physician in one week.   Time spent on day of discharge in discharge activity was 45 minutes.      ____________________________ Victor Davenport. Labib Cwynar, MD srs:nts D: 03/17/2013 13:45:16 ET T: 03/18/2013 01:08:43 ET JOB#: [id number]  cc: [name], MD, <Dictator> Dr. Helane RimaPerry Vijay Durflinger Davenport Duard Spiewak MD ELECTRONICALLY SIGNED 03/18/2013 23:43

## 2014-12-20 NOTE — Consult Note (Signed)
Chief Complaint:  Subjective/Chief Complaint Pt states she has improved sob . Still has leg edema.   VITAL SIGNS/ANCILLARY NOTES: **Vital Signs.:   17-Jul-14 07:55  Vital Signs Type Routine  Temperature Temperature (F) 97.6  Celsius 36.4  Temperature Source oral  Pulse Pulse 90  Respirations Respirations 16  Systolic BP Systolic BP 259  Diastolic BP (mmHg) Diastolic BP (mmHg) 67  Mean BP 78  Pulse Ox % Pulse Ox % 95  Pulse Ox Activity Level  At rest  Oxygen Delivery 2L  *Intake and Output.:   17-Jul-14 12:15  Grand Totals Intake:  240 Output:      Net:  240 107 Hr.:  230  Oral Intake      In:  240  Percentage of Meal Eaten  100   Brief Assessment:  GEN well developed, well nourished, no acute distress   Cardiac Regular  murmur present  + LE edema  + JVD   Respiratory normal resp effort  rhonchi  crackles   Gastrointestinal Normal   Gastrointestinal details normal Soft   EXTR negative cyanosis/clubbing, positive edema   Lab Results: Routine Chem:  17-Jul-14 02:18   Result Comment TROPONIN - RESULTS VERIFIED BY REPEAT TESTING.  - PREVIOUS CALL:03/14/13 AT 1052.Marland KitchenMarland KitchenTPL  Result(s) reported on 15 Mar 2013 at 03:19AM.  Result Comment ANION GAP - UNABLE TO CALCULATE ANION GAP DUE TO   - ABNORMALLY HIGH CO2.  - TPL CO2 - RESULTS VERIFIED BY REPEAT TESTING.  - TPL/SHARON BUCHANAN AT 5638 03/15/13  - NOTIFIED OF CRITICAL VALUE  - READ-BACK PROCESS PERFORMED.  Result(s) reportedon 15 Mar 2013 at 03:19AM.  B-Type Natriuretic Peptide [facility])  9300 (Result(s) reported on 15 Mar 2013 at 02:58AM.)  Glucose, Serum  191  BUN  26  Creatinine (comp) 1.24  Sodium, Serum 136  Potassium, Serum 3.6  Chloride, Serum  93  CO2, Serum  45  Calcium (Total), Serum 8.5  Anion Gap SEE COMMENT  Osmolality (calc) 282  eGFR (African American) >60  eGFR (Non-African American) >60 (eGFR values <48m/min/1.73 m2 may be an indication of chronic kidney disease (CKD). Calculated eGFR is useful  in patients with stable renal function. The eGFR calculation will not be reliable in acutely ill patients when serum creatinine is changing rapidly. It is not useful in  patients on dialysis. The eGFR calculation may not be applicable to patients at the low and high extremes of body sizes, pregnant women, and vegetarians.)  Magnesium, Serum 1.8 (1.8-2.4 THERAPEUTIC RANGE: 4-7 mg/dL TOXIC: > 10 mg/dL  -----------------------)  Cardiac:  17-Jul-14 02:18   Troponin I  0.18 (0.00-0.05 0.05 ng/mL or less: NEGATIVE  Repeat testing in 3-6 hrs  if clinically indicated. >0.05 ng/mL: POTENTIAL  MYOCARDIAL INJURY. Repeat  testing in 3-6 hrs if  clinically indicated. NOTE: An increase or decrease  of 30% or more on serial  testing suggests a  clinically important change)  Routine Hem:  17-Jul-14 02:18   WBC (CBC) 8.2  RBC (CBC)  4.12  Hemoglobin (CBC)  11.6  Hematocrit (CBC)  37.7  Platelet Count (CBC) 183  MCV 91  MCH 28.1  MCHC  30.8  RDW  16.3  Neutrophil % 74.4  Lymphocyte % 11.1  Monocyte % 12.1  Eosinophil % 1.9  Basophil % 0.5  Neutrophil # 6.1  Lymphocyte #  0.9  Monocyte # 1.0  Eosinophil # 0.2  Basophil # 0.0 (Result(s) reported on 15 Mar 2013 at 03:19AM.)   Assessment/Plan:  Assessment/Plan:  Assessment  IMP CHF CM HTN Pleural effusion Hypoxemia Hyperlipidemia DM .   Plan PLAN Agree with s/p thoracentisis 02 [place] Lasix IV for diuresis Coreg  Agree with spironolactone Consider low dose dig Continue DM control Support stockings Increase activity Medical therapy AICD for long term therapy   Electronic Signatures: Lujean Amel D (MD)  (Signed 17-Jul-14 16:58)  Authored: Chief Complaint, VITAL SIGNS/ANCILLARY NOTES, Brief Assessment, Lab Results, Assessment/Plan   Last Updated: 17-Jul-14 16:58 by Yolonda Kida (MD)

## 2014-12-20 NOTE — H&P (Signed)
PATIENT NAME:  Victor Davenport, Victor Davenport MR#:  [id number] DATE OF BIRTH:  1946-01-01  DATE OF ADMISSION:  04/04/2013  REFERRING PHYSICIAN: Dr. Margarita Grizzle.  PRIMARY CARE PHYSICIAN: Dr. Marina Goodell.  CHIEF COMPLAINT: Shortness of breath.   HISTORY OF PRESENT ILLNESS: The patient is a 69 year old Caucasian male with history of chronic systolic CHF, ischemic cardiomyopathy, EF of 15% to 20%, with 2 recent hospitalizations in the last month for CHF, who presents with progressive shortness of breath on exertion, lower extremity edema and a  6 to 7 weight gain in the last week or so. The wife and son are in the room. As he is discussing the situation, he states that he has been having increased lower extremity edema and this morning woke up acutely, could not breathe and had difficulty talking. He states he has been taking his Lasix. When asked how much fluid he drinks, he says not much, but the wife is in the room and she shakes her head. When asked again how much fluids he drinks, the wife states that he drank 1/2 gallon of milk in a day and drinks 3 to 4 cans of [facility] in addition to water. He denies having any fevers or chills. Hospitalist services were contacted for further evaluation and management. He has been given aspirin as well as Lasix IV. He is also noted to be 85% oxygenation on 2 liters of oxygen. He is on chronic oxygen. However, his O2 sat improved after he was increased to 4 liters.   PAST MEDICAL HISTORY:  1.  Chronic respiratory failure.  2.  Chronic systolic CHF, EF of 15% to 20%.  3.  Hypertension.  4.  Obesity.  5.  CAD, status post MI.  6.  Diabetes.  7.  Hyperlipidemia.   PAST SURGICAL HISTORY:  1.  Stent placement x 2 in 2013 and x 2 after that. 2.  Status post permanent defibrillator.  3.  Tonsillectomy.   ALLERGIES: No known drug allergies.   SOCIAL HISTORY: Married, lives with his wife. Denies drinking or smoking. He quit in 2006. He was a long-time smoker.   FAMILY HISTORY:  Positive for MI in the mother. Positive for cerebral hemorrhage in the dad. No cancer or diabetes.   OUTPATIENT MEDICATIONS: Advair 250/50 mcg 1 puff 2 times a day, albuterol CFC-free 100 mcg with ipratropium 20 mcg 4 times a day, Aldactone 12.5 mg daily, aspirin 81 mg daily, Ativan 0.5 mg 1 tab 3 times a day as needed for anxiety, furosemide 40 mg in the evening and 80 mg in the morning, Lipitor 80 mg at bedtime, metformin 1000 mg 2 times a day, NovoLog sliding scale, Plavix 75 mg daily, ramipril 1.25 mg daily.   REVIEW OF SYSTEMS:    CONSTITUTIONAL: No fever, but fatigue and weight gain of 6 to 7 pounds in the last week or so.  EYES: Off-and-on blurry vision. No double vision, glaucoma or cataracts.  EARS, NOSE, THROAT: No tinnitus or hearing loss.  RESPIRATORY: No cough, but had wheezing. Positive shortness of breath and significant orthopnea. He cannot lie flat and sleeps in a reclining position. Increased swelling in the legs. No palpitations. No chest pains.  GASTROINTESTINAL: No nausea, vomiting, diarrhea, abdominal pain or dark or tarry stools.  GENITOURINARY: Denies dysuria, hematuria or frequency.  HEMATOLOGIC AND LYMPHATIC: No anemia, but the patient does have easy bruising.  SKIN: No new rashes.  MUSCULOSKELETAL: Denies arthritis.  NEUROLOGIC: No numbness or weakness, CVA or TIA.  PSYCHIATRIC: Denies anxiety  or insomnia.   PHYSICAL EXAMINATION: VITAL SIGNS: Temperature on arrival is 97.9, pulse rate 97, respiratory rate 20, blood pressure 113/82, oxygen saturation 94% on oxygen 4 liters, but he was 85% on 2 liters, which is his baseline oxygen level.  GENERAL: The patient is sitting on the edge of bed in no obvious distress, talking in full sentences. HEENT: Normocephalic, atraumatic. Pupils are equal and reactive. Anicteric sclerae. Extraocular muscles intact. Moist mucous membranes.  NECK: Supple. No thyroid tenderness. No cervical lymphadenopathy. No particular JVD could be  appreciated.  CARDIOVASCULAR: S1, S2, regular rate and rhythm. No murmurs, rubs or gallops.  LUNGS: Bilateral bases decreased breath sounds and crackles. No wheezing or rhonchi.  ABDOMEN: Soft, nontender, nondistended. Positive bowel sounds. No organomegaly appreciated. EXTREMITIES: He has 2+ extremity edema bilaterally going to his thighs, mild erythema below the shins and chronic venous stasis changes and some blisters.  SKIN: Multiple tattoos. NEUROLOGIC: Cranial nerves II through XII grossly intact. Strength is 5 out of 5 in all extremities. Sensation is intact to light touch.  PSYCHIATRIC: Awake, alert, oriented, cooperative.   LABORATORY DATA: WBC 7.3, hemoglobin 14, platelets 185. BNP is 11,298. BUN 21, creatinine 1.07, sodium 134, potassium 4.8. Troponin 0.16, CK-MB 2.4, CK total 31.   EKG: Rate is 98, normal sinus rhythm, left atrial enlargement, inferior and posterior Q waves. There are Q waves in II, aVF, as well as V4, V5, V6. No acute ST elevations.  Chest portable x-ray, 1 view, showing findings concerning for congestive heart failure.   ASSESSMENT AND PLAN: We have a 69 year old Caucasian male with severe ischemic cardiomyopathy, ejection fraction of 15% to 20%, history of myocardial infarction, diabetes, hypertension, and appears to be noncompliant with diet and therefore comes in to the hospital with weight gain, lower extremity edema, dyspnea on exertion, acute on chronic respiratory failure. He appears to have acute on chronic systolic congestive heart failure as well.   In regards to his acute on chronic hypoxic respiratory failure, this is likely secondary to acute on chronic systolic congestive heart failure. He was counseled about his fluid intake. He is in denial of how much fluid he is drinking, and the whole time his wife was shaking her head, implying that he drinks too much fluids. Again, he was counseled that this is his third admission in the last month or so because of  fluids. At this time, we would admit him to the hospital to the telemetry unit. We would check ins and outs. Start him on Lasix intravenously twice daily, 40 mg for now. Obtain a cardiology consult. Resume his angiotensin-converting enzyme inhibitor. Add nitro paste and oxygen supplementation. He appears to have low cardiac reserve given his bad ejection fraction and at higher risk of developing congestive heart failure, and this was discussed with him. He does not appear to be wheezy. He has no fever or leukocytosis to suggest he is having a pneumonia. I do not see why he is not on a beta blocker. I will go ahead and start low-dose Coreg. He does not appear to have any allergies. I would continue his inhaler and cycle the troponins. Although he has a positive troponin, he has chronic elevation of troponin. This could be demand ischemia related secondary to acute on chronic congestive heart failure. I would continue his statin,  aspirin and the Plavix. For diabetes, I would hold the orals and put him on sliding scale insulin.   CODE STATUS: The patient is full code.  TOTAL TIME SPENT: 60 minutes.    ____________________________ Krystal EatonShayiq Ji Feldner, MD sa:jm D: 04/04/2013 13:15:14 ET T: 04/04/2013 13:37:42 ET JOB#: [id number]  cc: Krystal EatonShayiq Libbey Duce, MD, <Dictator> Krystal EatonSHAYIQ Ijeoma Loor MD ELECTRONICALLY SIGNED 04/22/2013 12:26

## 2014-12-20 NOTE — Consult Note (Signed)
Present Illness 69 yo male with history of cardiomyopathy with ef of 15-20%, history of aicd placement for primary prevention, history  of cad s/p pci per his report who was admitted after presenting with complaints of severe scrotal swelling. He was admitted with tune up of his chf, ischemic cardiompathy before consideration for outpatient surgical intervention of his scrotal ischemia. He denies chest pain but does have exertional shortness of breath. CXR reveals mild congestive heart failure.. He has a mild troponin elevation. He deneis chest pain. He reports compliance with his medications. He denies an firing of his aicd.   Physical Exam:  GEN well developed, well nourished   HEENT PERRL   NECK No masses   RESP no use of accessory muscles  crackles   CARD Regular rate and rhythm  Murmur   Murmur Systolic   Systolic Murmur axilla   ABD denies tenderness  normal BS   LYMPH negative neck   EXTR negative cyanosis/clubbing   SKIN normal to palpation   NEURO cranial nerves intact, motor/sensory function intact   PSYCH A+O to time, place, person   Review of Systems:  Subjective/Chief Complaint scrotal swelling   General: Fatigue  Fever/chills  Weakness   Skin: No Complaints   ENT: No Complaints   Eyes: No Complaints   Neck: No Complaints   Respiratory: Short of breath   Cardiovascular: Dyspnea  Orthopnea   Gastrointestinal: No Complaints   Genitourinary: No Complaints   Vascular: No Complaints   Musculoskeletal: No Complaints   Neurologic: No Complaints   Hematologic: No Complaints   Endocrine: No Complaints   Psychiatric: No Complaints   Review of Systems: All other systems were reviewed and found to be negative   Medications/Allergies Reviewed Medications/Allergies reviewed   Home Medications: Medication Instructions Status  NovoLOG FlexPen 100 units/mL subcutaneous solution  subcutaneous  Active  Lipitor 80 mg oral tablet 1 tab(s) orally once a  day (at bedtime) Active  Aldactone 25 mg oral tablet 12.5 milligram(s) orally  Active  Aspir 81 81 mg oral tablet 1 tab(s) orally once a day Active  Ativan 0.5 mg oral tablet 1 tab(s) orally 3 times a day, As Needed Active  Plavix 75 mg oral tablet 1 tab(s) orally once a day Active  ramipril 1.25 mg oral capsule 1 cap(s) orally once a day Active  Lasix 40 mg oral tablet  orally 3 times a day Active  metFORMIN 1000 mg oral tablet 1 tab(s) orally 2 times a day Active   EKG:  EKG NSR   Interpretation sinus rhythm with v paced    No Known Allergies:    Impression 69 yo male with history of dilated cardiomyopathy with history of aicd placement for primary prevention, history of cad sp pci per his report who was admitted with scrotal swelling which will need surgical intervention. He has mild troponin elevation on admission suggestive of demand ischemia. He has evidence of acute on chronic systollic congestive heart failure wiht nyha class iv.  Would conitnue with spironolacatone, carvedilol and diuresis following exam and renal function. EF by echo remains 15-20%. Would not proceed with invasive evaluaiton at present. Would proceed with surgery after diuresis   Plan 1. Conitnue with spironolactone, ace i 2. Careful diuresis following hemodynamics and renal funciton 3. Follow exam cxr and renal function and proceed with surgery with careful monitoring of fluid status 4. Will disconitnue metoprolol and start carvedilol 3.125 bid 5. Will follwo with you   Electronic Signatures: Harold HedgeFath, Kenneth  A (MD)  (Signed 10-Jul-14 20:31)  Authored: General Aspect/Present Illness, History and Physical Exam, Review of System, Home Medications, EKG , Allergies, Impression/Plan   Last Updated: 10-Jul-14 20:31 by Dalia Heading (MD)

## 2014-12-20 NOTE — H&P (Signed)
PATIENT NAME:  Victor Davenport, Victor Davenport MR#:  [id number] DATE OF BIRTH:  [birth year]  DATE OF ADMISSION:  03/08/2013   REFERRING PHYSICIAN:  Dr. Lavella Lemons.   CHIEF COMPLAINT:  Bilateral swelling of the lower extremities with scrotal swelling, redness and pain for one week.   HISTORY OF PRESENT ILLNESS:  The patient is a 69 year old Caucasian male with a past medical history of coronary artery disease, hypertension, insulin-dependent diabetes mellitus and hyperlipidemia, is presenting to the ER with a chief complaint of worsening of the bilateral lower extremity edema, scrotal swelling, redness and pain for the past one week.  The patient is reporting that he has chronic history of congestive heart failure with an ejection fraction of 15% status post defibrillator and sees Dr. Gwen Pounds as an outpatient, has been experiencing chronic exertional dyspnea and sleeps in a recliner for the past several years, is coming to the ER with worsening of the bilateral lower extremity edema associated with scrotal swelling, pain and redness.  The patient is reporting that the pain in the scrotum is getting worse until yesterday and today it is much better.  During my examination in the presence of patient's nurse Mr. Reuel Boom as the chaperone, the patient denies any pain.  The patient was in fact evaluated by Dr. Egbert Garibaldi and was diagnosed with a chronic incarcerated inguinal hernia regarding which he was recommended to follow up with Dr. Egbert Garibaldi as an outpatient.  The patient's BNP is elevated and a troponin is also elevated in the ER.  The patient denies any chest pain or shortness of breath.  Denies any weight gain either.  Concern about his worsening of the lower extremity edema.  The patient's initial troponin is at 0.14 and repeat one was at 0.20.  BNP is elevated at 9286.  The patient sees Dr. Gwen Pounds as an outpatient and his recent ejection fraction was 15% as reported by the patient.  During my exam he is resting comfortably and  denies any other complaints.  The patient had CAT scan of the abdomen and pelvis in the ER which has revealed large bilateral pleural effusions and bibasilar atelectasis.   PAST MEDICAL HISTORY:  Coronary artery disease, hypertension, obesity, hyperlipidemia, insulin-dependent diabetes mellitus.   PAST SURGICAL HISTORY:  Coronary artery disease status post stent placement, status post defibrillator placement.   ALLERGIES:  The patient has no known drug allergies.   PSYCHOSOCIAL HISTORY:  Lives at home with wife.  Denis any current history of smoking, alcohol or illicit drug usage.   FAMILY HISTORY:  Mom deceased with heart attack when he was young.    REVIEW OF SYSTEMS:   CONSTITUTIONAL:  Denies any fever or fatigue, weakness.  The patient was having scrotal pain until yesterday, but denies any now.  Denies any weight gain either.  EYES:  Denies any blurry vision, glaucoma, cataracts.  EARS, NOSE, THROAT:  No epistaxis, postnasal drip or sinus pain.  RESPIRATION:  Denies cough, shortness of breath, hemoptysis.  CARDIOVASCULAR:  No chest pain, palpitations or syncope.  GASTROINTESTINAL:  Denies nausea, vomiting, diarrhea.  GENITOURINARY:  Denies any dysuria or hematuria, but complaining of scrotal swelling with pain for the past one week which was much better today.  Has a chronic history of right inguinal hernia.   ENDOCRINE:  Denies any polyuria, nocturia or thyroid problems.  HEMATOLOGIC AND LYMPHATIC:  No anemia, easy bruising or bleeding.  INTEGUMENTARY:  No acne, rash, lesions.  MUSCULOSKELETAL:  No joint pain in the neck, back, shoulder.  Denies any gout.  NEUROLOGIC:  Denies any history of vertigo, ataxia, dementia.   PSYCHIATRIC:  Denies any insomnia, ADD, OCD, bipolar disorder   PHYSICAL EXAMINATION: VITAL SIGNS:  Temperature 98.3, pulse 95 to 102, respirations 20, blood pressure 112/72, pulse ox 97% on 2 liters of oxygen.  The patient chronically lives on 2 liters of oxygen.   GENERAL APPEARANCE:  Not under acute distress, resting comfortably in upright position.  Moderately built and moderately nourished.  HEENT:  Normocephalic, atraumatic.  Pupils are equally reacting to light and accommodation.  No scleral icterus.  No conjunctival injection.  No sinus tenderness.  No postnasal drip.  No pharyngeal exudates.  NECK:  Supple.  No JVD.  No thyromegaly.  No lymphadenopathy.  LUNGS:  Moderate air entry.  Positive rales at the bases bilaterally, palpable intact defibrillator.  No anterior chest wall tenderness on palpation.  No accessory muscle usage.  CARDIOVASCULAR:  S1, S2 normal.  Regular rate and rhythm.  No murmurs.  GASTROINTESTINAL:  Soft.  Bowel sounds are positive in all four quadrants.  Nontender, nondistended.  No masses felt.  No hepatosplenomegaly.  NEUROLOGIC:  Awake, alert, oriented x 3.  Cranial nerves II through XII are intact.  Motor and sensory are grossly intact.  Reflexes are 2+.  EXTREMITIES:  Chronic bilateral lower extremity edema which is 3+ and both bilateral lower extremities are rocky hard which is chronic in nature according to the patient.  On the right shin, there is superficial laceration which does not look infected, not bleeding with no discharge.  GENITOURINARY:  Edematous scrotum.  Positive erythema.  No discharge.  Penis is retracted, not tender during my examination.  Right groin area is macerated, erythematous with white flakes and scales.  SKIN:  Warm to touch.  Normal turgor.  No rashes.  No lesions.  No acne.  PSYCHIATRIC:  Normal mood and affect.  MUSCULOSKELETAL:  No joint effusion, tenderness, erythema.   LABORATORY AND IMAGING STUDIES:  CAT scan of the abdomen and pelvis has revealed a large right inguinal hernia which contains loops of large and small bowel.  This extends down into the right scrotum.  Fluid in the scrotum compatible with hydrocele.  No obstruction or incarceration present. CXR- bilateral effusions with bilateral  atelectasis.  Diffuse edema in the subcutaneous tissue.  Glucose 270.  BNP is elevated at 9286, BUN 21, creatinine 1.27, sodium 141, potassium 4.9, chloride 100, CO2 35, GFR greater than 60, anion gap 6, osmolality of the serum 294, calcium and magnesium are normal range.  Troponin first one is at 0.14 and the repeat troponin is at 0.20.  LFTs, total protein 7.1, total bili 0.5, albumin 3.2, AST is 13, ALT 19, alkaline phosphatase 154.  WBC is 8.6, hemoglobin 13.5, hematocrit 42.3, platelets 194.  PT 14.3, INR 1.1.  Urinalysis yellow in color, clear in appearance.  Nitrite, leukocyte esterase are negative.   ASSESSMENT AND PLAN:  A 69 year old Caucasian male coming to the ER with worsening of the bilateral lower extremity edema associated with scrotal swelling with pain for the past one week, chronic exertional dyspnea with known ejection fraction of 15%, chronically incarcerated right inguinal hernia, will be admitted for medical management of acute exacerbation of congestive heart failure and scrotal cellulitis.  The patient was seen by Dr. Egbert GaribaldiBird in the ER who has recommended outpatient follow-up regarding hernia repair.  1.  Worsening of bilateral lower extremity edema with scrotal edema, which could be probably from acute exacerbation of congestive  heart failure.  It is systolic in nature with known ejection fraction of 15%.  We will provide him Lasix 40 mg IV q. 12 hours.  We will provide him aspirin, beta-blocker and statin.  We will repeat echocardiogram and cardiology consult is placed to Dr. Gwen Pounds, the patient's cardiologist.  We will check daily weights.  2.  Elevated troponin with history of coronary artery disease and stents in the past, probably from demand ischemia.  The patient denies any chest pain, but given the past medical history of coronary artery disease with three heart attacks and two stents and patient being a diabetic, with tending up troponins, we will provide him one dose of Lovenox  1 mg/kg subQ in the ER.  We will keep him nothing by mouth and cycle cardiac biomarkers.  Implement acute coronary syndrome protocol.  Further continuation of Lovenox is per rounding physicians and cardiologist discretion depending on the troponin levels.  3.  Scrotal cellulitis with possible prostatitis, hydrocele and intertrigo.  The patient will be on IV levofloxacin and we will provide him Diflucan.  We will put a consult to Dr. Egbert Garibaldi.  4.  Insulin-dependent diabetes mellitus.  The patient will be on sliding scale insulin as currently he is nothing by mouth.  5.  Hypertension.  We will resume his home medications and add a small dose of metoprolol.    6.  We will provide him GI prophylaxis.  The patient was given Lovenox 1 mg/kg subQ in the ER.  7.  He is FULL CODE.  Medical power of attorney is his wife.   Total time spent reviewing old medical records, discussing with the ER staff, discussing with the patient and physical examination of the patient is 55 minutes.     ____________________________ Ramonita Lab, Victor Davenport ag:ea D: 03/08/2013 03:56:50 ET T: 03/08/2013 06:05:48 ET JOB#: [id number]  cc: Ramonita Lab, Victor Davenport, <Dictator> Victor Blinks, Victor Davenport Outside Physician  Ramonita Lab Victor Davenport ELECTRONICALLY SIGNED 03/14/2013 0:46

## 2014-12-20 NOTE — Consult Note (Signed)
Brief Consult Note: Diagnosis: Resp failure/CHF/Pleural effusion.   Patient was seen by consultant.   Consult note dictated.   Recommend to proceed with surgery or procedure.   Recommend further assessment or treatment.   Orders entered.   Discussed with Attending MD.   Comments: IMP CHF acute/chronic systolic CM systolic Edema Resp failure acute/chronic CAD Bilat pleural effusions DM HTN Non compliance . PLAN Agree with thoracentisis 02 Inhalers Diuretics ACE Coreg Tele Enzymes Medical therapy AICD? as otpt.  Electronic Signatures: Dorothyann Pengallwood, Dwayne D (MD)  (Signed 17-Jul-14 07:44)  Authored: Brief Consult Note   Last Updated: 17-Jul-14 07:44 by Alwyn Peaallwood, Dwayne D (MD)

## 2014-12-20 NOTE — Discharge Summary (Signed)
PATIENT NAME:  Victor Davenport, Victor Davenport MR#:  [id number] DATE OF BIRTH:  [birth year]  DATE OF ADMISSION:  04/04/2013 DATE OF DISCHARGE:  04/08/2013  DISCHARGE DIAGNOSES 1.  Acute systolic heart failure.  2.  Elevated troponin due to stress demand ischemia.  3.  Diabetes.  4.  Hyperlipidemia.   CONDITION ON DISCHARGE: Stable.   CODE STATUS: Full code.   MEDICATIONS ON DISCHARGE 1.  NovoLog subcutaneous as needed for sliding scale.  2.  Lipitor 80 mg oral tablet once a day.  3.  Ativan 0.5 mg oral tablet 3 times a day as needed for anxiety.  4.  Plavix 75 mg once a day.  5.  Ramipril 1.25 mg oral capsule once a day.  6.  Albuterol and ipratropium 1 puff 4 times a day.  7.  Aldactone 25 mg take 1/2 tablet once a day.  8.  Aspirin 81 mg once a day.  9.  Advair Diskus 1 puff inhalation 2 times a day.  10.  Furosemide 40 mg once a day.  11.  Glipizide extra-long-acting 2.5 mg oral tablet once a day.  12.  Carvedilol 3.125 mg 2 times a day.   HOME HEALTH ON DISCHARGE: Yes.   HOME HEALTH SERVICES: Nurse.   HOME OXYGEN: Yes, 3 liters nasal cannula oxygen.   DIET ON DISCHARGE: Low sodium, low fat, low cholesterol, carbohydrate-controlled, ADA diet. Diet consistency regular.   ACTIVITY: As tolerated.   TIMEFRAME TO FOLLOWUP: Within 1 to 2 weeks. Advised to follow with PMD in 2 weeks and to be compliant with medication. Total fluid intake in a day should be 1.5 liters.   HISTORY OF PRESENT ILLNESS: The patient is a 69 year old Caucasian male with history of chronic systolic CHF, ischemic cardiomyopathy, ejection fraction 15% with 2 recent hospitalizations in the last month, CHF, presented with progressive shortness of breath on exertion, lower extremity edema and 6 to 7-pound weight gain in the last 1 week. He stated that he has been taking his Lasix and wife says that he drinks half gallon of milk in a day and [facility] 3 to 4 [facility] in addition to water. He denied any fever,  chills or cough. He was admitted for acute CHF exacerbation and respiratory failure. Oxygen saturation noted 85% on 2 liters on admission.   HOSPITAL COURSE AND STAY:  1.  CHF.  This was acute on chronic systolic dysfunction, excessive fluid intake and we gave him IV Lasix. He had slight upgrading BUN and creatinine so we cut down Lasix after 2 days. The patient started feeling much better after that and we discharged him with strict advice to be compliant with fluid intake and Lasix.  2.  Elevated troponin. Likely this was due to CHF demand ischemia. We continued aspirin, Plavix, beta blocker and statin.  3.  Diabetes. We continued sliding scale coverage and blood sugar remained under control.  4.  Hyperlipidemia. We continued atorvastatin.    IMPORTANT LAB RESULTS IN THE HOSPITAL: Chest x-ray, portable, single view, findings concerning of congestive heart failure. BNP on admission 11,298. WBC 7300,  platelet count 185. Glucose was 186, BUN 21, creatinine 1.04, potassium 4.8, chloride 96, alkaline phosphatase was 381 on admission. Troponin was 0.16 which on followup came to 0.16 and 0.16. Creatinine the next day went up to 1.24 and on further followup on 9th of August came down to 1.06.   TOTAL TIME SPENT ON THIS DISCHARGE: 40 minutes.   ____________________________ Hope PigeonVaibhavkumar G. Elisabeth PigeonVachhani,  MD vgv:cs D: 04/11/2013 13:50:26 ET T: 04/11/2013 14:03:11 ET JOB#: [id number]  cc: Hope Pigeon. Elisabeth Pigeon, MD, <Dictator> Altamese Dilling MD ELECTRONICALLY SIGNED 04/17/2013 11:22

## 2014-12-20 NOTE — Consult Note (Signed)
PATIENT NAME:  Victor Davenport, Victor Davenport MR#:  [id number] DATE OF BIRTH:  07-Dec-1945  DATE OF CONSULTATION:  04/05/2013  REFERRING PHYSICIAN: Dr. Allena Katz  CONSULTING PHYSICIAN:  Lamar Blinks, MD  REASON FOR CONSULTATION: Acute on chronic congestive heart failure, old myocardial infarction, diabetes, hypertension, hyperlipidemia and status post defibrillator placement.   CHIEF COMPLAINT: "I got short of breath."   HISTORY OF PRESENT ILLNESS: This is a 69 year old male with known previous acute on chronic congestive heart failure having significant progression of shortness of breath, pulmonary edema and hypoxia with acute on chronic congestive heart failure, systolic in nature. The patient has had an old myocardial infarction with a recent defibrillator placement 3 months ago for which he has not had any recent episodes of sudden death or ventricular tachycardia, but has had continued weakness, fatigue and shortness of breath with any physical activity, relieved by rest. There has been no evidence of chest discomfort today and troponin and CK-MB ordered within normal limits. EKG has shown normal sinus rhythm, left axis deviation and anterior and inferior infarct age undetermined. Otherwise, diabetes, hypertension, hyperlipidemia have been stable.  He does have coronary artery disease with a previous stent placement in multiple arteries, which is stable without chest pain today.   REVIEW OF SYSTEMS: The remainder review of systems negative for vision change, ringing in the ears, hearing loss, cough, congestion, heartburn, nausea, vomiting, diarrhea, bloody stools, stomach pain, extremity pain, leg weakness, cramping of the buttocks, known blood clots, headaches, blackouts, dizzy spells, nosebleeds, congestion, trouble swallowing, frequent urination, urination at night, muscle weakness, numbness, anxiety, depression, skin lesions or skin rashes.   PAST MEDICAL HISTORY:  1.  Heart failure. 2.  Myocardial  infarction.  3.  Stent placements.  4.  Diabetes.  5.  Hypertension.  6.  Hyperlipidemia.  FAMILY HISTORY: Mother had a remote myocardial infarction.   SOCIAL HISTORY: Has remote tobacco and alcohol use.   ALLERGIES: As listed.   MEDICATIONS: As listed.   PHYSICAL EXAMINATION:  VITAL SIGNS: Blood pressure 112/68 bilaterally, heart rate 62 upright, reclining, and regular.  GENERAL: He is a well appearing male in no acute distress.  HEENT: No icterus, thyromegaly, ulcers, hemorrhage, or xanthelasma.  CARDIOVASCULAR: Regular rate and rhythm. Normal S1 and S2 with 2 out of 6 apical murmur consistent with mitral regurgitation. PMI is diffuse. Carotid upstroke normal without bruit. Jugular venous pressure is normal.  LUNGS: Bibasilar crackles with few expiratory wheezes.  ABDOMEN: Soft, nontender, without hepatosplenomegaly or masses. Abdominal aorta is normal size without bruit.  EXTREMITIES: 2+ radial, femoral, dorsal pedal pulses with 1+ lower extremity edema. No cyanosis, clubbing, or ulcers.  NEUROLOGIC: He is oriented to time, place, and person, with normal mood and affect.   ASSESSMENT: This is a 69 year old male with acute on chronic congestive heart failure with systolic dysfunction from previous myocardial infarctions, diabetes, hypertension and hyperlipidemia slightly improved with diuretics and currently without evidence of myocardial infarction.   RECOMMENDATIONS:  1.  Continue serial ECG and enzymes to assess for possible myocardial infarction.  2.  Echocardiogram for extent of LV dysfunction, acute on chronic systolic congestive heart failure.  3.  Diuretics including Lasix for lower extremity edema and pulmonary edema. 4.  Possible defibrillator interrogation for further evaluation and treatment options of above.  5.  Further adjustments of medication management including beta blocker and ACE inhibitor for cardiovascular disease and congestive heart failure.  6.  Further  treatment options after above.  ____________________________ Lamar Blinks, MD  bjk:aw D: 04/05/2013 09:10:17 ET T: 04/05/2013 09:19:02 ET JOB#: [id number]  cc: Lamar BlinksBruce J. Ashantia Amaral, MD, <Dictator> Lamar BlinksBRUCE J Danaya Geddis MD ELECTRONICALLY SIGNED 04/06/2013 14:07
